# Patient Record
Sex: Male | Born: 1960 | Race: Black or African American | Hispanic: No | Marital: Married
Health system: Southern US, Community
[De-identification: ages and names within clinical notes are randomized; demographics above are authoritative.]

---

## 2002-04-06 ENCOUNTER — Encounter: Payer: Self-pay | Admitting: Emergency Medicine

## 2002-04-06 ENCOUNTER — Emergency Department (HOSPITAL_COMMUNITY): Admission: EM | Admit: 2002-04-06 | Discharge: 2002-04-06 | Payer: Self-pay | Admitting: Emergency Medicine

## 2005-01-16 ENCOUNTER — Emergency Department (HOSPITAL_COMMUNITY): Admission: EM | Admit: 2005-01-16 | Discharge: 2005-01-16 | Payer: Self-pay | Admitting: Emergency Medicine

## 2006-12-02 IMAGING — CT CT LTD SINUS
1 series · 16 of 18 positions shown, 20 images · non-contrast
Comparison: none

______________________________________________________________
DEYOUNG, FRANCISCO JAVIER 02/20/1982 LENCIAUSKIENE, VEJUNAS [DATE]

REASON FOR CONSULTATION: R/O SDH
____________________________________________
EXAM: HEAD W/O CONTRAST

[Series 2: — · axial · 0.39mm/px · z∈[-431,-326]mm · 16 of 18 slices shown, 20 images]
[im 2/18  brain]
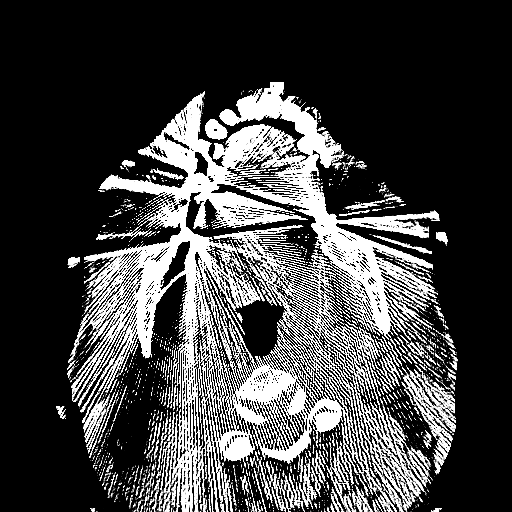
[im 2/18  bone]
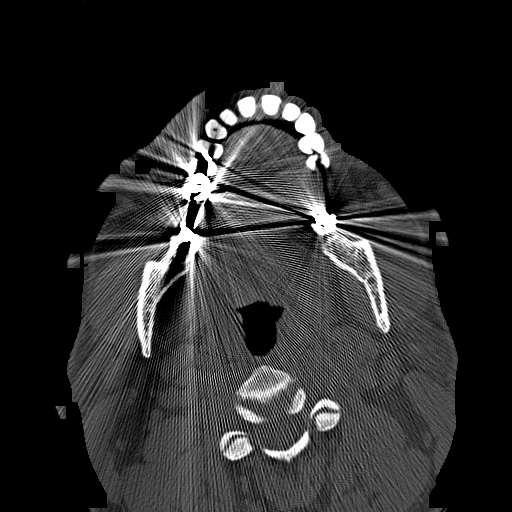
[im 3/18  bone]
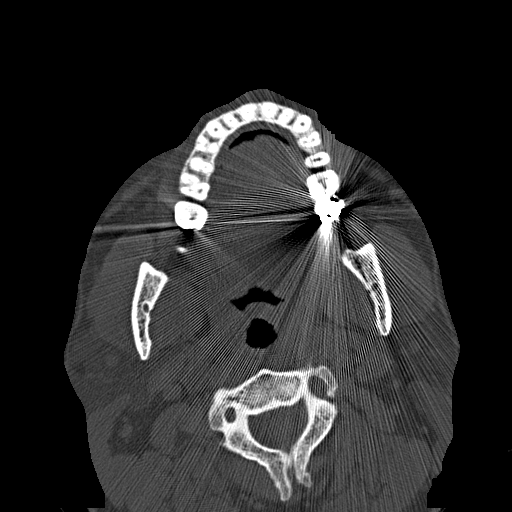
[im 4/18  bone]
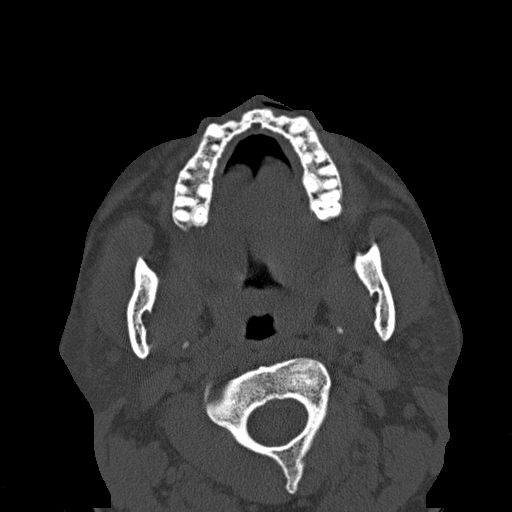
[im 5/18  bone]
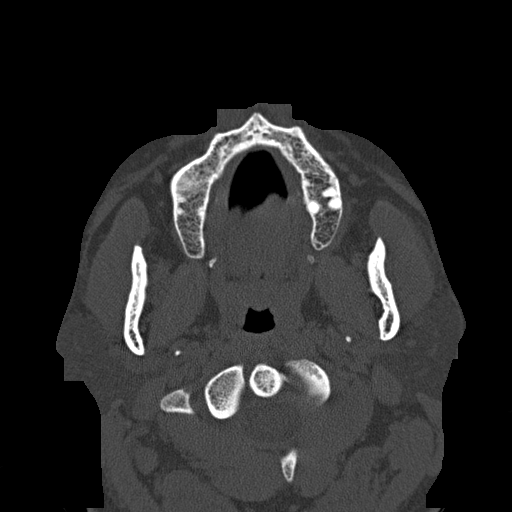
[im 6/18  brain]
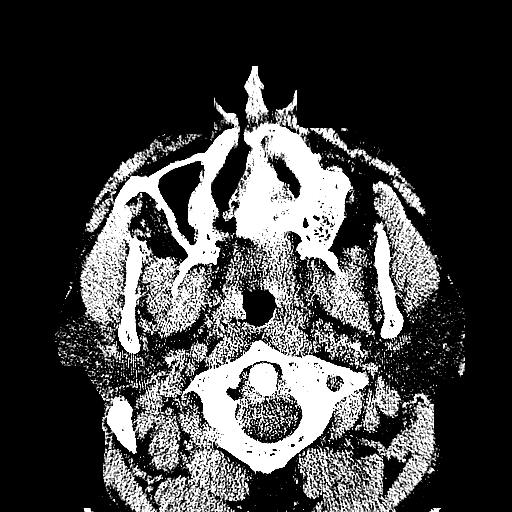
[im 6/18  bone]
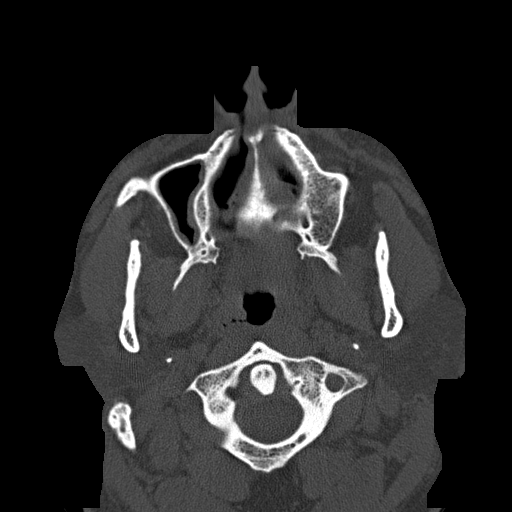
[im 7/18  bone]
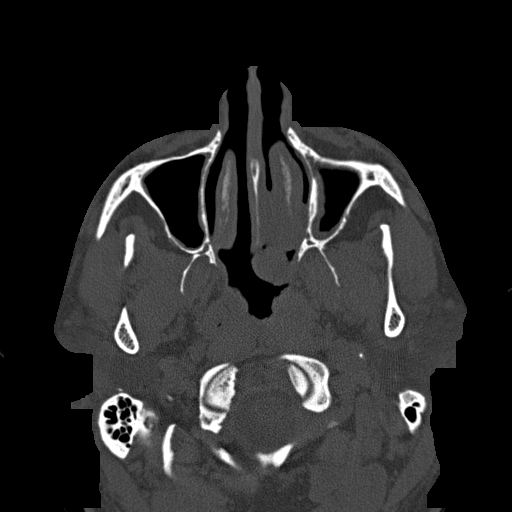
[im 8/18  bone]
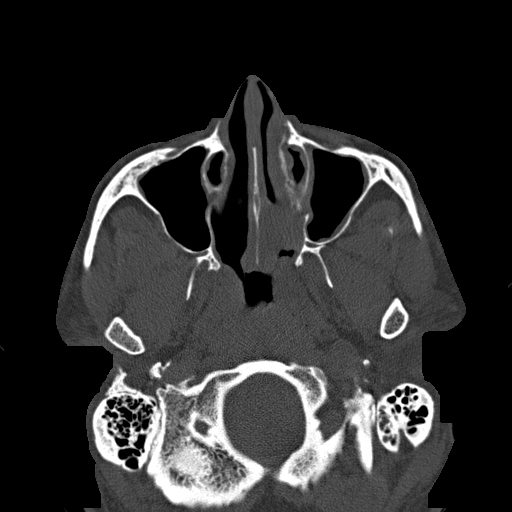
[im 9/18  bone]
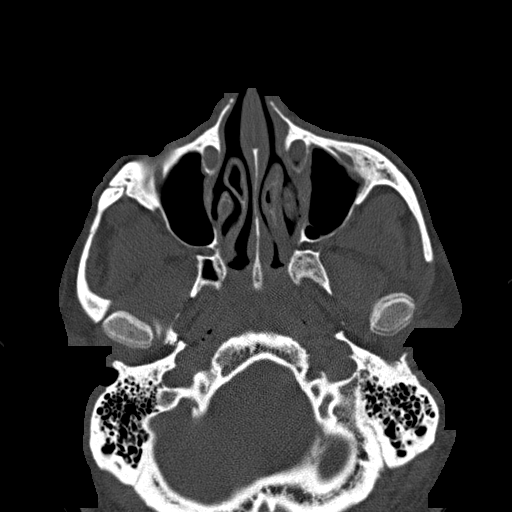
[im 10/18  brain]
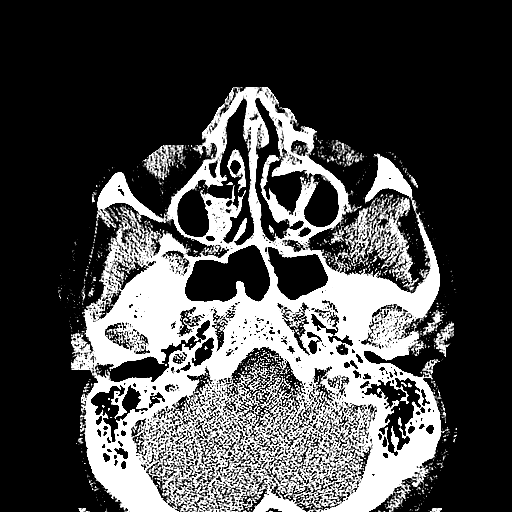
[im 10/18  bone]
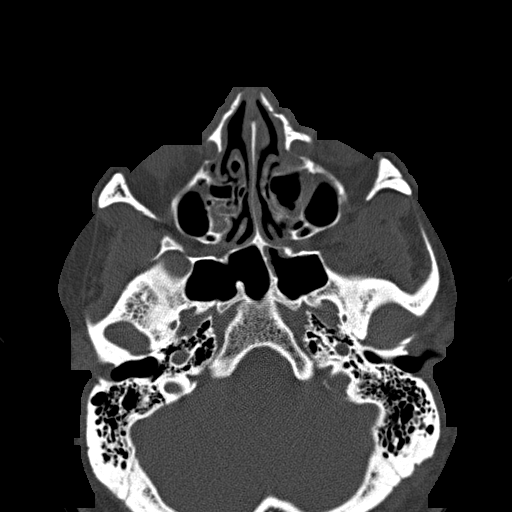
[im 11/18  bone]
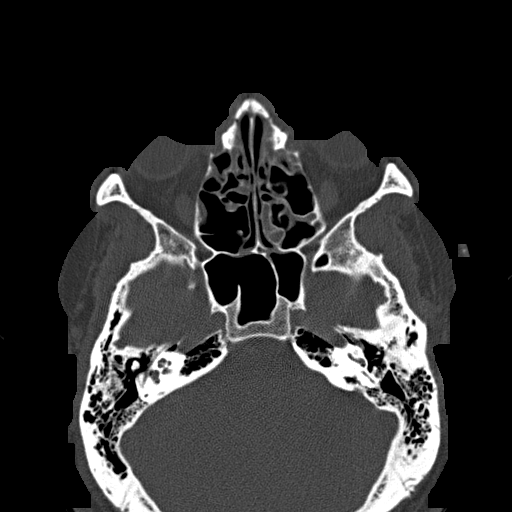
[im 12/18  bone]
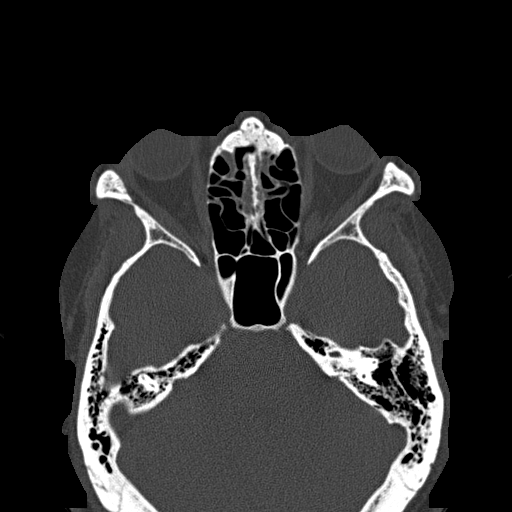
[im 13/18  bone]
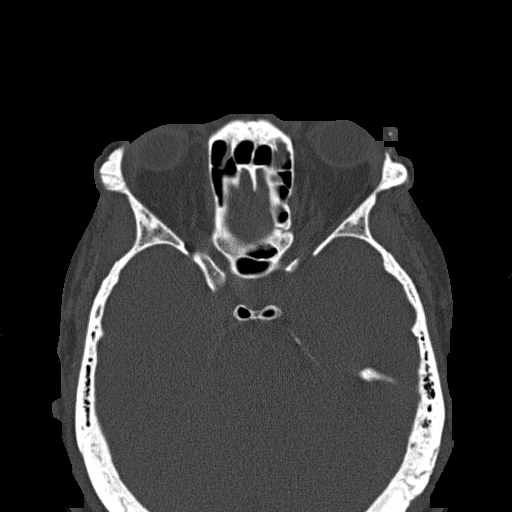
[im 14/18  brain]
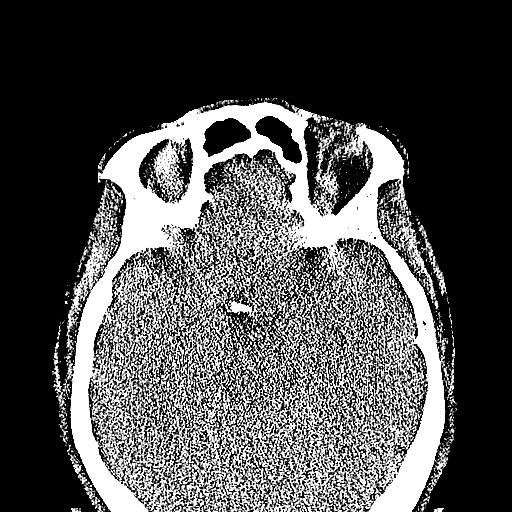
[im 14/18  bone]
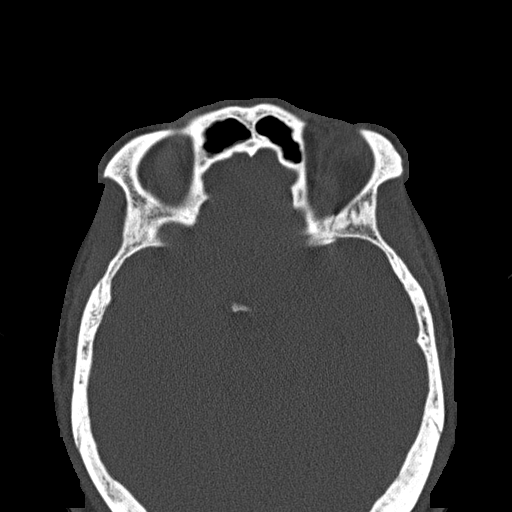
[im 15/18  bone]
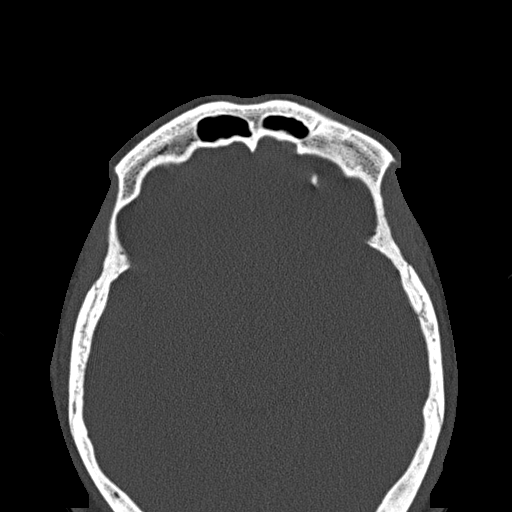
[im 16/18  bone]
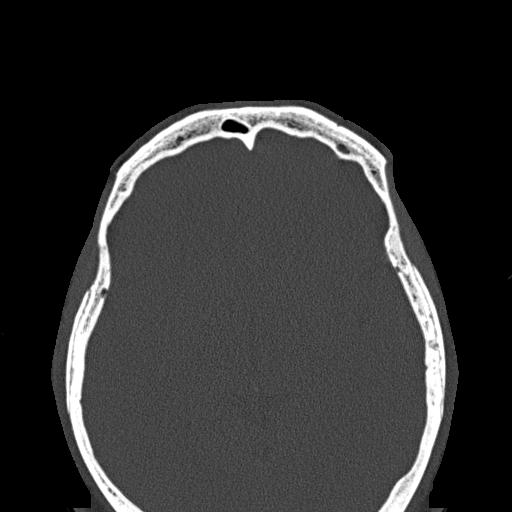
[im 17/18  bone]
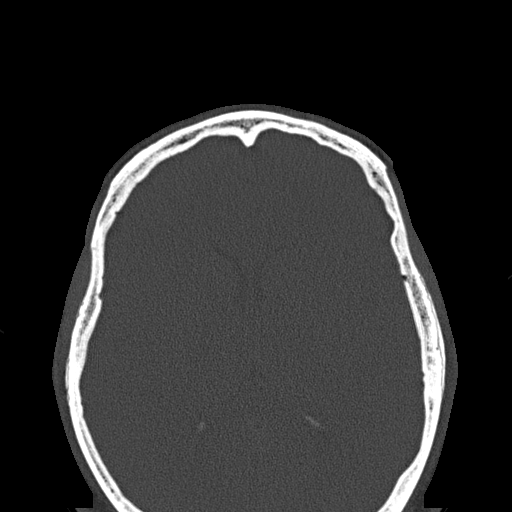

[16 of 18 positions shown; findings below may reference images not displayed]

FINDINGS: Emergent unenhanced brain CT was performed in the standard
fashion. Bone windows were also filmed. There are no prior studies for
comparison. Bone windows show no evidence of fracture.

p>There is no evidence of hematoma or intraparenchymal hemorrhage. There
is no edema, mass effect or shift of midline structures. The ventricles
appear normal in size.
IMPRESSION: 1. Negative emergent brain CT.

<p

 Patient DEYOUNG, FRANCISCO JAVIER [DATE]

## 2006-12-29 IMAGING — CT CT HEAD W/O CM
1 series · 16 of 30 positions shown, 20 images · IV contrast (agent unspecified)
Comparison: none

CLINICAL DATA: Syncope.  
 HEAD CT WITHOUT CONTRAST:
TECHNIQUE: Contiguous axial images were obtained from the base of the skull through the vertex according to standard protocol without contrast.

[Series 2: routine head 4.8 h45s · axial · 0.43mm/px · z∈[+1186,+1346]mm · 16 of 36 slices shown, 20 images]
[im 2/36  brain]
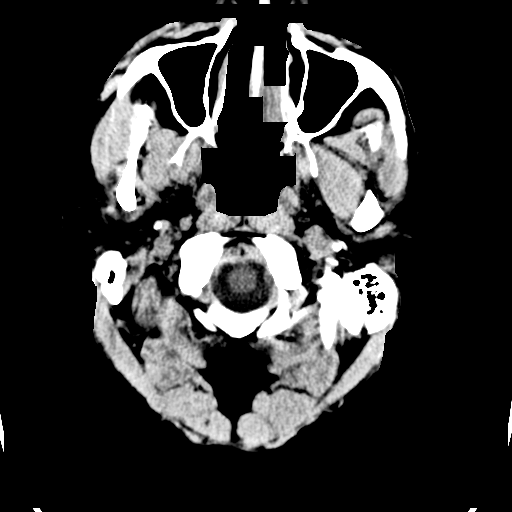
[im 2/36  bone]
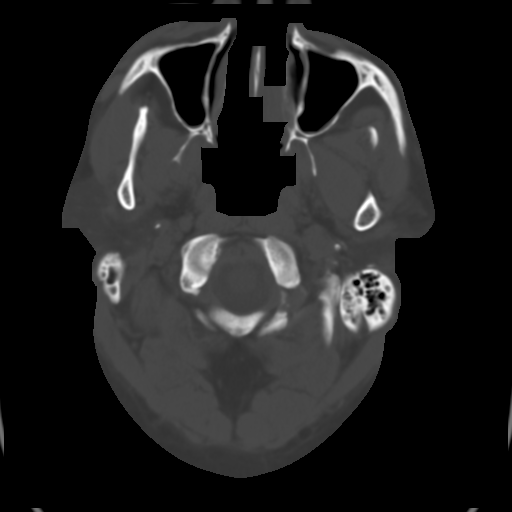
[im 4/36  brain]
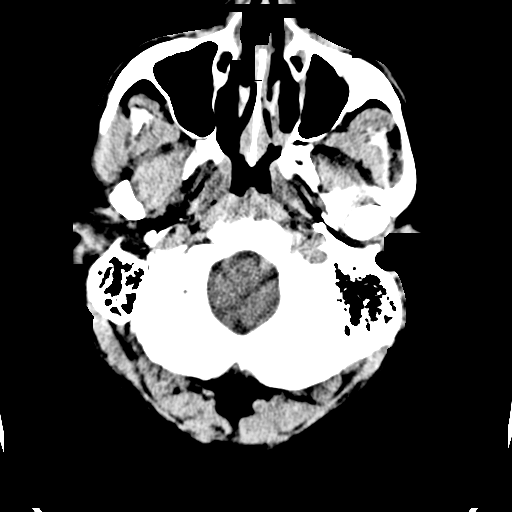
[im 7/36  brain]
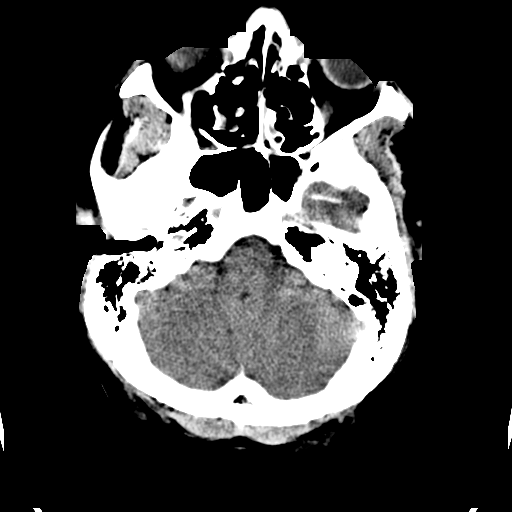
[im 9/36  brain]
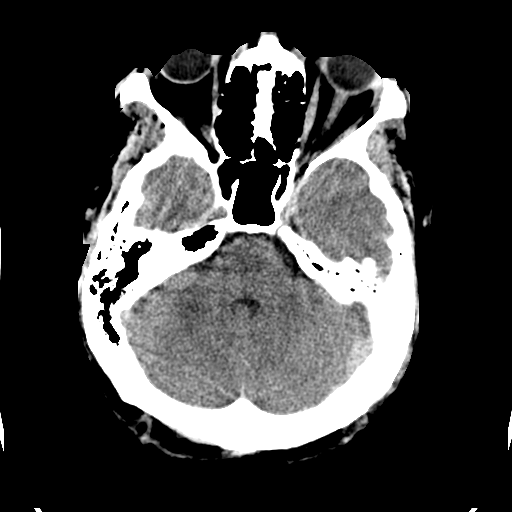
[im 10/36  brain]
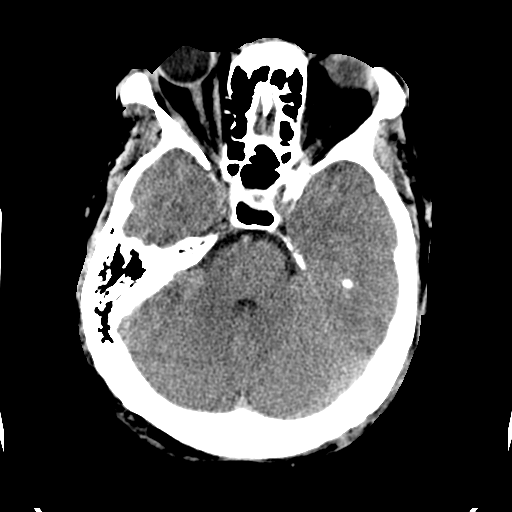
[im 10/36  bone]
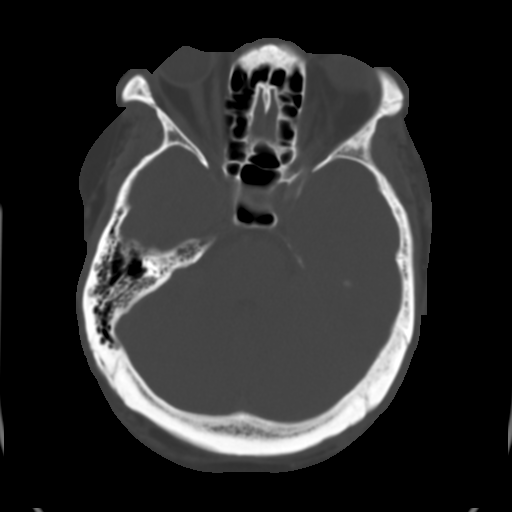
[im 13/36  brain]
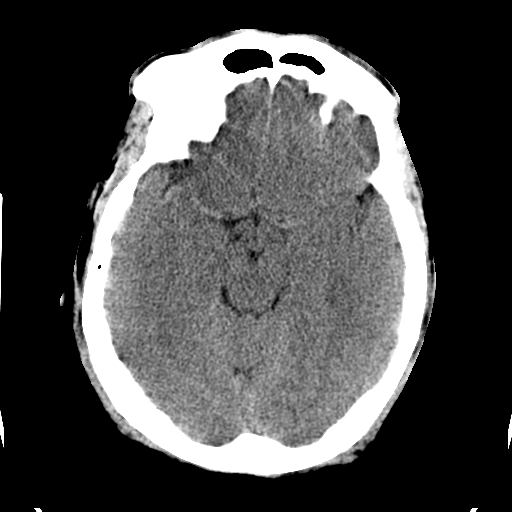
[im 15/36  brain]
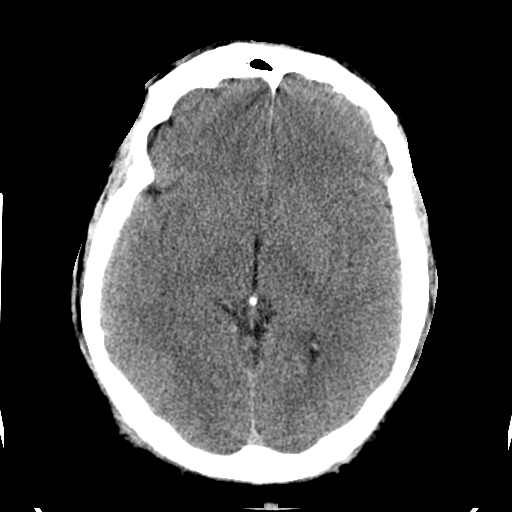
[im 17/36  brain]
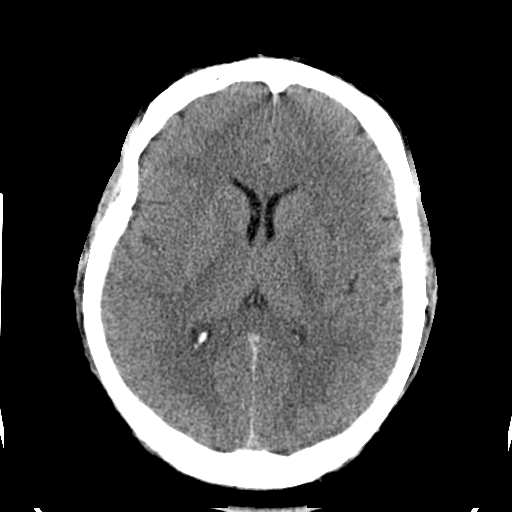
[im 19/36  brain]
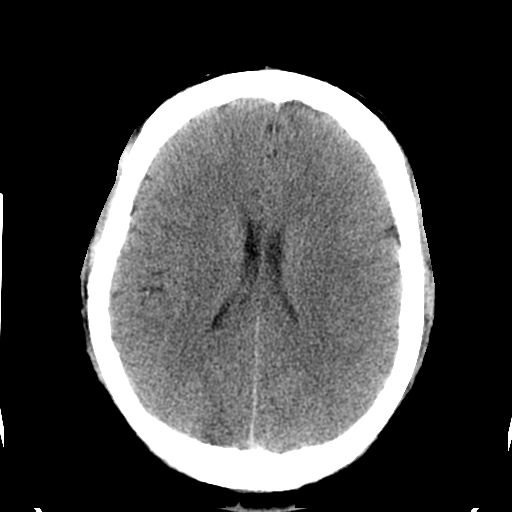
[im 19/36  bone]
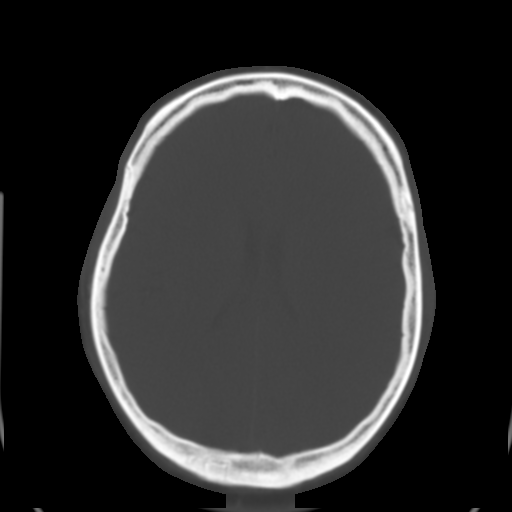
[im 21/36  brain]
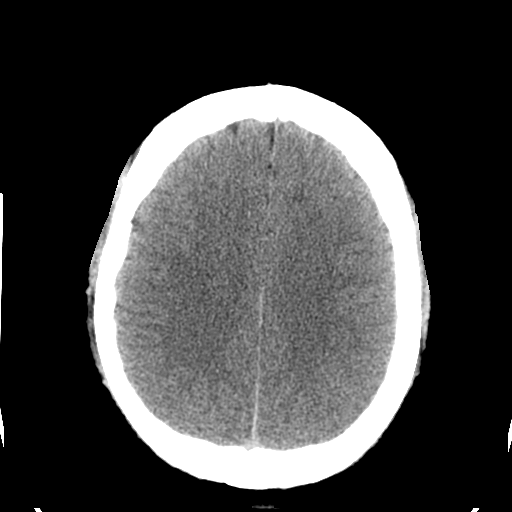
[im 23/36  brain]
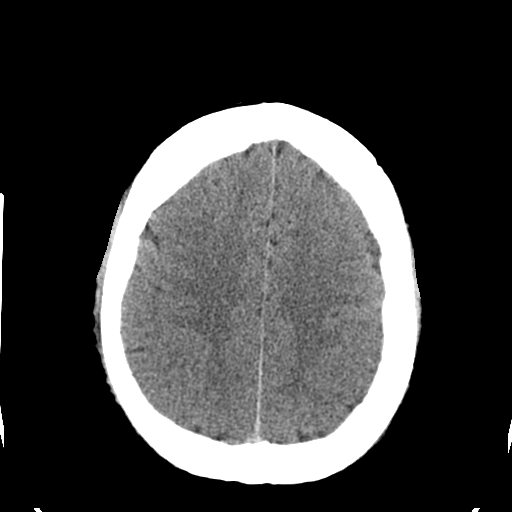
[im 26/36  brain]
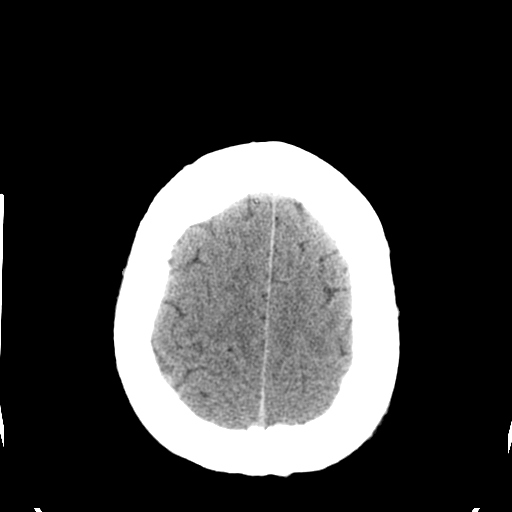
[im 27/36  brain]
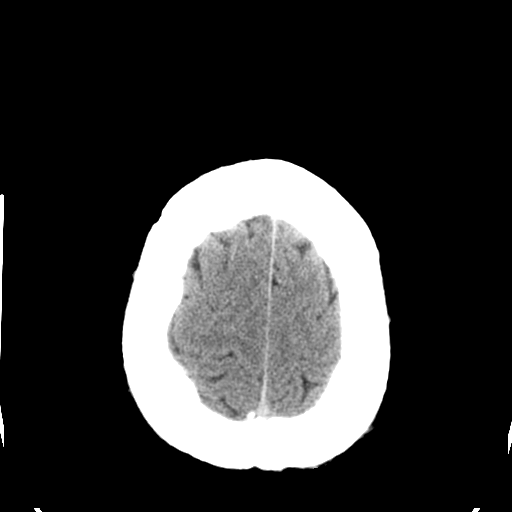
[im 27/36  bone]
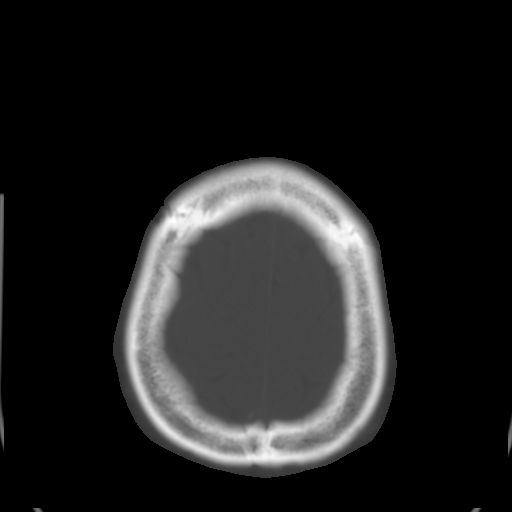
[im 29/36  brain]
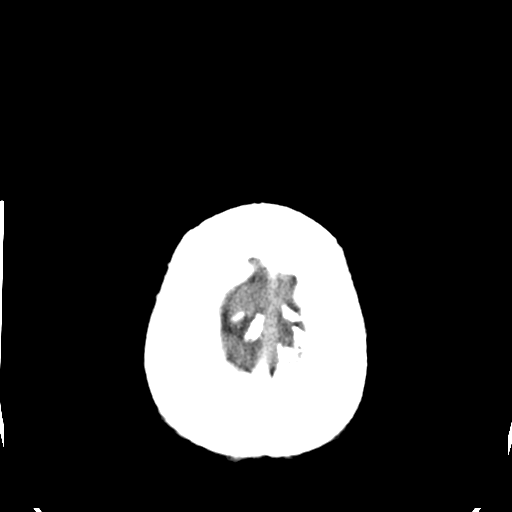
[im 32/36  brain]
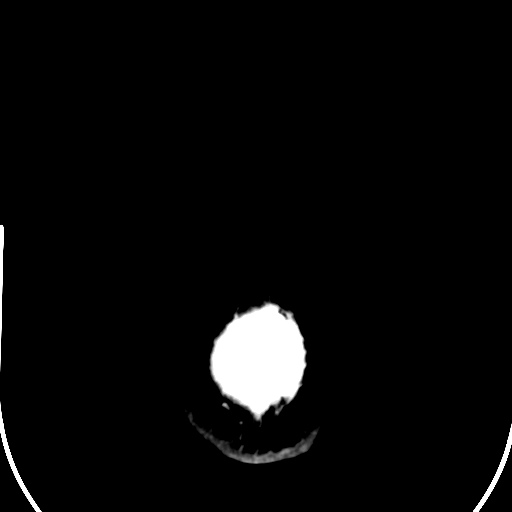
[im 34/36  brain]
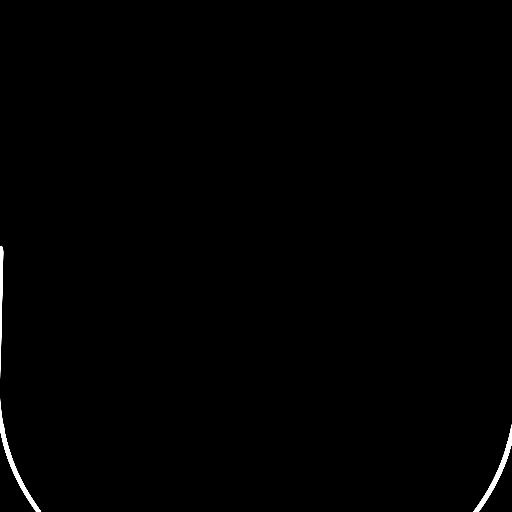

[16 of 30 positions shown; findings below may reference images not displayed]

FINDINGS: There is no evidence of acute intracranial abnormality including mass or mass effect, hydrocephalus, extra-axial fluid collection, midline shift, hemorrhage, or infarct.  Acute infarct may be missed by CT for 24-48 hours.  The visualized bony calvarium is unremarkable.
IMPRESSION: No evidence of acute intracranial abnormality.

## 2008-01-01 ENCOUNTER — Ambulatory Visit: Payer: Self-pay | Admitting: Gastroenterology

## 2008-01-10 ENCOUNTER — Ambulatory Visit: Payer: Self-pay | Admitting: Gastroenterology

## 2008-02-05 ENCOUNTER — Ambulatory Visit: Payer: Self-pay | Admitting: Gastroenterology

## 2008-02-06 ENCOUNTER — Ambulatory Visit: Payer: Self-pay | Admitting: Internal Medicine

## 2008-02-19 ENCOUNTER — Ambulatory Visit: Payer: Self-pay | Admitting: Gastroenterology

## 2008-03-11 ENCOUNTER — Ambulatory Visit: Payer: Self-pay | Admitting: Gastroenterology

## 2008-03-26 ENCOUNTER — Ambulatory Visit: Payer: Self-pay | Admitting: Gastroenterology

## 2008-04-01 ENCOUNTER — Ambulatory Visit: Payer: Self-pay | Admitting: Gastroenterology

## 2013-11-27 ENCOUNTER — Encounter: Payer: Self-pay | Admitting: Internal Medicine

## 2016-03-02 DIAGNOSIS — B359 Dermatophytosis, unspecified: Secondary | ICD-10-CM | POA: Diagnosis not present

## 2016-03-02 DIAGNOSIS — E119 Type 2 diabetes mellitus without complications: Secondary | ICD-10-CM | POA: Diagnosis not present

## 2016-05-24 DIAGNOSIS — I10 Essential (primary) hypertension: Secondary | ICD-10-CM | POA: Diagnosis not present

## 2016-05-24 DIAGNOSIS — E7800 Pure hypercholesterolemia, unspecified: Secondary | ICD-10-CM | POA: Diagnosis not present

## 2016-05-24 DIAGNOSIS — E119 Type 2 diabetes mellitus without complications: Secondary | ICD-10-CM | POA: Diagnosis not present

## 2016-05-27 DIAGNOSIS — J45909 Unspecified asthma, uncomplicated: Secondary | ICD-10-CM | POA: Diagnosis not present

## 2016-08-17 DIAGNOSIS — M79641 Pain in right hand: Secondary | ICD-10-CM | POA: Diagnosis not present

## 2016-08-18 DIAGNOSIS — M79643 Pain in unspecified hand: Secondary | ICD-10-CM | POA: Diagnosis not present

## 2017-01-28 DIAGNOSIS — E119 Type 2 diabetes mellitus without complications: Secondary | ICD-10-CM | POA: Diagnosis not present

## 2017-01-28 DIAGNOSIS — R21 Rash and other nonspecific skin eruption: Secondary | ICD-10-CM | POA: Diagnosis not present

## 2017-03-27 DIAGNOSIS — J069 Acute upper respiratory infection, unspecified: Secondary | ICD-10-CM | POA: Diagnosis not present

## 2017-03-27 DIAGNOSIS — R21 Rash and other nonspecific skin eruption: Secondary | ICD-10-CM | POA: Diagnosis not present

## 2017-06-30 DIAGNOSIS — L300 Nummular dermatitis: Secondary | ICD-10-CM | POA: Diagnosis not present

## 2017-09-22 DIAGNOSIS — E7800 Pure hypercholesterolemia, unspecified: Secondary | ICD-10-CM | POA: Diagnosis not present

## 2017-09-22 DIAGNOSIS — E119 Type 2 diabetes mellitus without complications: Secondary | ICD-10-CM | POA: Diagnosis not present

## 2017-09-22 DIAGNOSIS — I10 Essential (primary) hypertension: Secondary | ICD-10-CM | POA: Diagnosis not present

## 2017-12-15 DIAGNOSIS — K219 Gastro-esophageal reflux disease without esophagitis: Secondary | ICD-10-CM | POA: Diagnosis not present

## 2017-12-15 DIAGNOSIS — R079 Chest pain, unspecified: Secondary | ICD-10-CM | POA: Diagnosis not present

## 2018-01-31 DIAGNOSIS — E7800 Pure hypercholesterolemia, unspecified: Secondary | ICD-10-CM | POA: Diagnosis not present

## 2018-01-31 DIAGNOSIS — E119 Type 2 diabetes mellitus without complications: Secondary | ICD-10-CM | POA: Diagnosis not present

## 2018-01-31 DIAGNOSIS — J019 Acute sinusitis, unspecified: Secondary | ICD-10-CM | POA: Diagnosis not present

## 2018-03-23 DIAGNOSIS — K219 Gastro-esophageal reflux disease without esophagitis: Secondary | ICD-10-CM | POA: Diagnosis not present

## 2018-03-24 DIAGNOSIS — E119 Type 2 diabetes mellitus without complications: Secondary | ICD-10-CM | POA: Diagnosis not present

## 2018-03-24 DIAGNOSIS — N400 Enlarged prostate without lower urinary tract symptoms: Secondary | ICD-10-CM | POA: Diagnosis not present

## 2018-03-24 DIAGNOSIS — Z1159 Encounter for screening for other viral diseases: Secondary | ICD-10-CM | POA: Diagnosis not present

## 2018-04-17 DIAGNOSIS — M79622 Pain in left upper arm: Secondary | ICD-10-CM | POA: Diagnosis not present

## 2018-04-21 DIAGNOSIS — Z1211 Encounter for screening for malignant neoplasm of colon: Secondary | ICD-10-CM | POA: Diagnosis not present

## 2018-04-21 DIAGNOSIS — Z800 Family history of malignant neoplasm of digestive organs: Secondary | ICD-10-CM | POA: Diagnosis not present

## 2018-04-21 DIAGNOSIS — D122 Benign neoplasm of ascending colon: Secondary | ICD-10-CM | POA: Diagnosis not present

## 2018-05-08 DIAGNOSIS — Z1159 Encounter for screening for other viral diseases: Secondary | ICD-10-CM | POA: Diagnosis not present

## 2018-05-08 DIAGNOSIS — E119 Type 2 diabetes mellitus without complications: Secondary | ICD-10-CM | POA: Diagnosis not present

## 2018-05-08 DIAGNOSIS — N400 Enlarged prostate without lower urinary tract symptoms: Secondary | ICD-10-CM | POA: Diagnosis not present

## 2018-05-25 ENCOUNTER — Ambulatory Visit
Admission: RE | Admit: 2018-05-25 | Discharge: 2018-05-25 | Disposition: A | Discharge status: Home / Self Care | Payer: Self-pay | Source: Ambulatory Visit | Attending: Family Medicine | Admitting: Family Medicine

## 2018-05-25 ENCOUNTER — Other Ambulatory Visit: Payer: Self-pay

## 2018-05-25 ENCOUNTER — Other Ambulatory Visit: Payer: Self-pay | Admitting: Family Medicine

## 2018-05-25 DIAGNOSIS — M542 Cervicalgia: Secondary | ICD-10-CM

## 2018-05-29 DIAGNOSIS — M5413 Radiculopathy, cervicothoracic region: Secondary | ICD-10-CM | POA: Diagnosis not present

## 2018-05-29 DIAGNOSIS — M546 Pain in thoracic spine: Secondary | ICD-10-CM | POA: Diagnosis not present

## 2018-05-29 DIAGNOSIS — M542 Cervicalgia: Secondary | ICD-10-CM | POA: Diagnosis not present

## 2018-05-31 DIAGNOSIS — M5413 Radiculopathy, cervicothoracic region: Secondary | ICD-10-CM | POA: Diagnosis not present

## 2018-05-31 DIAGNOSIS — M546 Pain in thoracic spine: Secondary | ICD-10-CM | POA: Diagnosis not present

## 2018-05-31 DIAGNOSIS — M542 Cervicalgia: Secondary | ICD-10-CM | POA: Diagnosis not present

## 2018-06-02 DIAGNOSIS — M5413 Radiculopathy, cervicothoracic region: Secondary | ICD-10-CM | POA: Diagnosis not present

## 2018-06-02 DIAGNOSIS — M546 Pain in thoracic spine: Secondary | ICD-10-CM | POA: Diagnosis not present

## 2018-06-02 DIAGNOSIS — M542 Cervicalgia: Secondary | ICD-10-CM | POA: Diagnosis not present

## 2018-06-05 DIAGNOSIS — M542 Cervicalgia: Secondary | ICD-10-CM | POA: Diagnosis not present

## 2018-06-05 DIAGNOSIS — M5413 Radiculopathy, cervicothoracic region: Secondary | ICD-10-CM | POA: Diagnosis not present

## 2018-06-05 DIAGNOSIS — M546 Pain in thoracic spine: Secondary | ICD-10-CM | POA: Diagnosis not present

## 2018-06-07 DIAGNOSIS — M5413 Radiculopathy, cervicothoracic region: Secondary | ICD-10-CM | POA: Diagnosis not present

## 2018-06-07 DIAGNOSIS — M546 Pain in thoracic spine: Secondary | ICD-10-CM | POA: Diagnosis not present

## 2018-06-07 DIAGNOSIS — M542 Cervicalgia: Secondary | ICD-10-CM | POA: Diagnosis not present

## 2018-06-12 DIAGNOSIS — M5413 Radiculopathy, cervicothoracic region: Secondary | ICD-10-CM | POA: Diagnosis not present

## 2018-06-12 DIAGNOSIS — M542 Cervicalgia: Secondary | ICD-10-CM | POA: Diagnosis not present

## 2018-06-12 DIAGNOSIS — M546 Pain in thoracic spine: Secondary | ICD-10-CM | POA: Diagnosis not present

## 2018-06-14 DIAGNOSIS — M542 Cervicalgia: Secondary | ICD-10-CM | POA: Diagnosis not present

## 2018-06-14 DIAGNOSIS — M546 Pain in thoracic spine: Secondary | ICD-10-CM | POA: Diagnosis not present

## 2018-06-14 DIAGNOSIS — M5413 Radiculopathy, cervicothoracic region: Secondary | ICD-10-CM | POA: Diagnosis not present

## 2018-06-19 DIAGNOSIS — M5413 Radiculopathy, cervicothoracic region: Secondary | ICD-10-CM | POA: Diagnosis not present

## 2018-06-19 DIAGNOSIS — M546 Pain in thoracic spine: Secondary | ICD-10-CM | POA: Diagnosis not present

## 2018-06-19 DIAGNOSIS — M542 Cervicalgia: Secondary | ICD-10-CM | POA: Diagnosis not present

## 2018-06-26 DIAGNOSIS — M5413 Radiculopathy, cervicothoracic region: Secondary | ICD-10-CM | POA: Diagnosis not present

## 2018-06-26 DIAGNOSIS — M546 Pain in thoracic spine: Secondary | ICD-10-CM | POA: Diagnosis not present

## 2018-06-26 DIAGNOSIS — M542 Cervicalgia: Secondary | ICD-10-CM | POA: Diagnosis not present

## 2018-06-29 DIAGNOSIS — M546 Pain in thoracic spine: Secondary | ICD-10-CM | POA: Diagnosis not present

## 2018-06-29 DIAGNOSIS — M5413 Radiculopathy, cervicothoracic region: Secondary | ICD-10-CM | POA: Diagnosis not present

## 2018-06-29 DIAGNOSIS — M542 Cervicalgia: Secondary | ICD-10-CM | POA: Diagnosis not present

## 2018-07-03 DIAGNOSIS — M5413 Radiculopathy, cervicothoracic region: Secondary | ICD-10-CM | POA: Diagnosis not present

## 2018-07-03 DIAGNOSIS — M546 Pain in thoracic spine: Secondary | ICD-10-CM | POA: Diagnosis not present

## 2018-07-03 DIAGNOSIS — M542 Cervicalgia: Secondary | ICD-10-CM | POA: Diagnosis not present

## 2018-07-06 DIAGNOSIS — M5413 Radiculopathy, cervicothoracic region: Secondary | ICD-10-CM | POA: Diagnosis not present

## 2018-07-06 DIAGNOSIS — M542 Cervicalgia: Secondary | ICD-10-CM | POA: Diagnosis not present

## 2018-07-06 DIAGNOSIS — M546 Pain in thoracic spine: Secondary | ICD-10-CM | POA: Diagnosis not present

## 2018-07-10 DIAGNOSIS — M546 Pain in thoracic spine: Secondary | ICD-10-CM | POA: Diagnosis not present

## 2018-07-10 DIAGNOSIS — M542 Cervicalgia: Secondary | ICD-10-CM | POA: Diagnosis not present

## 2018-07-10 DIAGNOSIS — M5413 Radiculopathy, cervicothoracic region: Secondary | ICD-10-CM | POA: Diagnosis not present

## 2018-07-13 DIAGNOSIS — M546 Pain in thoracic spine: Secondary | ICD-10-CM | POA: Diagnosis not present

## 2018-07-13 DIAGNOSIS — M5413 Radiculopathy, cervicothoracic region: Secondary | ICD-10-CM | POA: Diagnosis not present

## 2018-07-13 DIAGNOSIS — M542 Cervicalgia: Secondary | ICD-10-CM | POA: Diagnosis not present

## 2018-09-14 ENCOUNTER — Other Ambulatory Visit: Payer: Self-pay | Admitting: Family Medicine

## 2018-09-14 DIAGNOSIS — M5412 Radiculopathy, cervical region: Secondary | ICD-10-CM

## 2018-09-14 DIAGNOSIS — M542 Cervicalgia: Secondary | ICD-10-CM

## 2018-09-24 ENCOUNTER — Other Ambulatory Visit: Payer: Self-pay

## 2019-01-02 ENCOUNTER — Ambulatory Visit: Payer: Self-pay | Admitting: Orthopedic Surgery

## 2019-01-17 ENCOUNTER — Inpatient Hospital Stay (HOSPITAL_COMMUNITY): Admission: RE | Admit: 2019-01-17 | Payer: Self-pay | Source: Ambulatory Visit

## 2019-01-22 ENCOUNTER — Inpatient Hospital Stay (HOSPITAL_COMMUNITY): Admission: RE | Admit: 2019-01-22 | Payer: Self-pay | Source: Ambulatory Visit

## 2019-01-24 ENCOUNTER — Encounter (HOSPITAL_COMMUNITY): Admission: RE | Payer: Self-pay | Source: Home / Self Care

## 2019-01-24 ENCOUNTER — Ambulatory Visit (HOSPITAL_COMMUNITY): Admission: RE | Admit: 2019-01-24 | Payer: 59 | Source: Home / Self Care | Admitting: Orthopedic Surgery

## 2019-01-24 SURGERY — ANTERIOR CERVICAL DECOMPRESSION/DISCECTOMY FUSION 2 LEVELS
Anesthesia: General

## 2019-12-04 ENCOUNTER — Ambulatory Visit: Payer: 59 | Attending: Internal Medicine

## 2019-12-04 DIAGNOSIS — Z23 Encounter for immunization: Secondary | ICD-10-CM

## 2019-12-04 NOTE — Progress Notes (Signed)
   [facility]  Name:  Jason Klein    MRN: [id number] DOB: 06-17-1960  12/04/2019  [name] was observed post Covid-19 immunization for 15 minutes without incident. He was provided with Vaccine Information Sheet and instruction to access the V-Safe system.   [name] was instructed to call [phone] with any severe reactions post vaccine: Marland Kitchen Difficulty breathing  . Swelling of face and throat  . A fast heartbeat  . A bad rash all over body  . Dizziness and weakness

## 2022-10-08 ENCOUNTER — Other Ambulatory Visit: Payer: Self-pay | Admitting: Family Medicine

## 2022-10-08 DIAGNOSIS — R2242 Localized swelling, mass and lump, left lower limb: Secondary | ICD-10-CM
